# Patient Record
Sex: Female | Born: 1965 | Hispanic: No | Marital: Married
Health system: Midwestern US, Community
[De-identification: ages and names within clinical notes are randomized; demographics above are authoritative.]

---

## 2012-02-29 LAB — BASIC METABOLIC PANEL
BUN: 5 mg/dL
Calcium: 8.7 mg/dL
Chloride: 101 mmol/L
Glucose: 118 mg/dL
Potassium: 4.3 mmol/L
Sodium: 138 mmol/L

## 2012-02-29 LAB — CBC
Hematocrit: 40.2 % (ref 36–46)
Hemoglobin: 13.7 g/dL (ref 12.0–16.0)
Platelets: 198 K/??L
WBC: 6.5 10^3/mL

## 2012-02-29 LAB — LIPID PANEL
Chol/HDL Ratio: 2.6
Cholesterol, Total: 210
Cholesterol, Total: 210
HDL: 78 mg/dL — AB (ref 35–70)
HDL: 78 mg/dL — AB (ref 35–70)
LDL Calculated: 131 mg/dL (ref 0–160)
LDL Calculated: 131 mg/dL (ref 0–160)
Triglycerides: 139 mg/dL
Triglycerides: 139 mg/dL
VLDL: 27 mg/dL

## 2012-02-29 LAB — COMPREHENSIVE METABOLIC PANEL
BUN: 5 mg/dL
Calcium: 8.7 mg/dL
Chloride: 101 mmol/L
Creatinine: 0.7
Gfr Calculated: 60
Glucose: 118 mg/dL
Potassium: 4.3 mmol/L
Sodium: 138 mmol/L

## 2012-02-29 LAB — AST(SGOT) & ALT(SGPT)
ALT: 49 U/L
AST: 45 U/L

## 2012-06-30 LAB — HEMOGLOBIN A1C
Hemoglobin A1C: 7.9 %
Hemoglobin A1C: 7.9 %

## 2012-07-12 MED ORDER — LANCETS MISC
Status: AC
Start: 2012-07-12 — End: ?

## 2012-07-12 MED ORDER — GLUCOSE BLOOD VI STRP
ORAL_STRIP | Status: AC
Start: 2012-07-12 — End: ?

## 2012-07-12 NOTE — Progress Notes (Signed)
Subjective:      Patient ID: Hailey Reynolds is a 47 y.o. female.    Diabetes  She presents for her initial diabetic visit. She has type 2 diabetes mellitus. Onset time: 2014. There are no hypoglycemic associated symptoms. There are no diabetic associated symptoms. There are no hypoglycemic complications. There are no diabetic complications. Risk factors for coronary artery disease include diabetes mellitus, family history, obesity and post-menopausal. Current diabetic treatment includes oral agent (monotherapy). She is compliant with treatment most of the time. Her weight is stable. She is following a generally healthy diet. When asked about meal planning, she reported none. She has not had a previous visit with a dietician. She rarely participates in exercise. There is no compliance with monitoring of blood glucose. An ACE inhibitor/angiotensin II receptor blocker is not being taken. She sees a podiatrist Warden/ranger every 3 weeks for nerve pain).Eye exam is current.       Review of Systems    Objective:   Physical Exam    Assessment:      1 hr pp 163 with 50 gm CHO's.  No A1c to evalaute.  Hailey Reynolds has not been metering and instruction was given on One Touch glucose meter (given) with e-script for strips and lancets called into [facility].  Reviewed CHO's, diet and lifestyle.   Hailey Reynolds states she has been on intermittent doses of steroids and as a result sugars elevated.  Reviewed diabetes pathophysiology with instruction given on appropriate metering and goals. PLAN: follow educational groups; then RD and RN      Plan:      1.  Check sugar with wake up (goal less than 130) OR 2 hr after a higher carb meal (goal less than 160)  2.  Keep food/sugar log and bring with meter to all clinic visits.  3.  Exercise is huge. Try to incorporate it in your day.  30 min a day most days of the week is the goal.  This will help you utilize your own insulin better.  4.  NO sugary drinks or juice.  Limit carbs to no more than  50 gm/meal or 3 servings to assist with sugar control and weight loss.  5.  Follow up educational groups.        Patient education time 60 min

## 2012-07-12 NOTE — Patient Instructions (Signed)
1.  Check sugar with wake up (goal less than 130) OR 2 hr after a higher carb meal (goal less than 160)  2.  Keep food/sugar log and bring with meter to all clinic visits.  3.  Exercise is huge. Try to incorporate it in your day.  30 min a day most days of the week is the goal.  This will help you utilize your own insulin better.  4.  NO sugary drinks or juice.  Limit carbs to no more than 50 gm/meal or 3 servings to assist with sugar control and weight loss.  5.  Follow up educational groups.

## 2012-07-19 NOTE — Progress Notes (Signed)
Patient presented for the [facility]  education class. The content was presented via PowerPoint, lecture and case-based format covering the following concepts: 60mins education.    ?? What is Diabetes?  ?? Define glycosolation  ?? Interpretation of the A1C and goal.  ?? Pancreatic function  ?? Target organs and macro and microvascular complications  ?? Goals for SGM BP goals  ?? Meal clearance   ?? S&S hyper/hypoglycemia and tx   ?? Insulin physiology-basal/bolus  ?? Navigating sick days stress  ?? Relationship between diet, exercise, meds and stress  ??  Utilizing heath care system at appropriate time  ?? Assuming responsibility in self management  ?? Troubleshooting patterns

## 2012-09-05 NOTE — Progress Notes (Deleted)
[facility]  [facility]  Nutrition Counseling  [address]  [place], [place] [address]  [phone] (phone)  [phone] (fax)    Patient Name: Hailey Reynolds. Date of Birth: [phone]. MRN: [id number]      Assessment: Patient is a 47 y.o. female seen for initial MNT visit for Type 2 Diabetes recent diagnosis. Vitals from current and previous visits:  ***   -Body mass index is 42.9 kg/(m^2). 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   -Weight goal: lose weight.   -Nutritionally relevant labs:   Lab Results   Component Value Date/Time    LABA1C 7.9 06/30/2012    LABA1C 7.9 06/30/2012    GLUCOSE 118 02/29/2012    GLUCOSE 118 02/29/2012    CHOL 210 02/29/2012    CHOL 210 02/29/2012    HDL 78* 02/29/2012    HDL 78* 02/29/2012    LDLCALC 131 02/29/2012    LDLCALC 131 02/29/2012    TRIG 139 02/29/2012    TRIG 139 02/29/2012     Vitals 07/12/2012 09/05/2012   SYSTOLIC 130    DIASTOLIC 82    PULSE 72    WEIGHT 264.4 257.8   HEIGHT 5\' 5"  5\' 5"    BMI 44 kg/m2 42.9 kg/m2     -Blood sugar trends: checks 2x/day. Morning when awake, then 2 hours after last meal, reports pretty good about it but occ forgets. 64 y.o. daughter, and husband lives with her. 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   -Newly dx with T2DM. Has been on Janumet (metformin) 2 mos., since then, food aversion to spring mix salad, doesn't want to cook much, doesn't eat rice or potatoes. Loves [place] foods and tortilla chips lately.  -Has been reading daughter's cho counting packet.  -Likes high fat foods but trying to watch portions, had portion of nachos bellgrande. Tries to count out chips when having snacks at home.  -Allergic to nuts.    -Food recall:   Breakfast: sometimes skips, often wakes up not hungry.   Lunch: (12-2pm) might stop and get fast food cheeseburger, doesn't  get fries anymore but eats out a lot. Tries to take "smart one" meals to work but sometimes doesn't get to it.  Dinner:(7pm-8:30), goes to bed at about 11pm-midnight.   Snacks: chips and salsa.   Fluids: Gave up pop, if wants sweet, has diet pop or tea with splenda or natural fruit tea.  Eats out: >4x/week at lunch time. On weekends rarely eats out.  Exercise: physical labor at job, walks dogs, cleans windows, mops.    -Impression of Dietary Intake: in general, an "unhealthy" diet, on average, 2 meals per day, on average, >3 fast food meals per week.    -  Current Outpatient Prescriptions on File Prior to Visit   Medication Sig Dispense Refill   . SitaGLIPtin-MetFORMIN HCl (JANUMET PO) Take  by mouth 2 times daily.       . cetirizine (ZYRTEC) 10 MG tablet Take 10 mg by mouth daily.       . Fluticasone Propionate (FLONASE NA) 2 sprays by Nasal route daily.       . Cyanocobalamin (VITAMIN B 12 PO) Take  by mouth once a week. Spray once a week       . citalopram (CELEXA) 10 MG tablet Take 10 mg  by mouth daily.       Marland Kitchen lamoTRIgine (LAMICTAL) 150 MG tablet Take 150 mg by mouth 2 times daily.       Marland Kitchen glucose blood VI test strips (ONE TOUCH ULTRA TEST) strip Test BID AD  Dx:  250.02  100 strip  11   . Lancets MISC Test BID AD.  Dx:  250.02  100 each  11     No current facility-administered medications on file prior to visit.       Nutrition Diagnosis: Food and nutrition-related knowledge deficit related to Lack of previous MNT/currently undergoing MNT as evidenced by Food/nutrition history-report or observation of: skipping breakfast and frequently eating out.    Intervention:  -Impression: Patient has been newly diagnosed with Type 2 DM. Has very basic understanding of diabetic diet from reading daughter's cho counting packet. Skips breakfast and relies heavily on fast food. Enc small, freq meals starting with eating breakfast and midmorning snack. Reports avoiding breads and does not seem to be getting adequate cho AEB  food log and pt report, also dislikes cooking and has been eating higher fat foods when eating out. When pt used to cook meals in advance husband would end up eating it so pt has been discouraged from cooking lately. Discussed benefits of pre portioning and cooking meals at home. Enc lean meats and reading food labels when grocery shopping. 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     -Instructed the patient on: Carbohydrate Counting, Consistent Carbohydrate Intake, Exchange System for Carbohydrate Counting & Meal Planning, Food Label Reading, Healthy Choices When Dining Out, Meal Planning for Regular, Balanced Meals & Snacks and The Importance of Regular Physical Activity.  -Handouts given for: carbohydrate counting, food label reading tips, food logging, healthy snacks and sample meal plans/menus.    -There are no Patient Instructions on file for this visit.    -General Diet Recommendations: low fat, low cholesterol, minimize processed foods, minimize simple sugars and increase fiber intake.  -Nutrition prescription: 1600-1800 calories/day, 180-190g carbs/day.     Comprehension verified using teachback method.    Monitoring/Evaluation:   -Followup visit: 4 weeks with dietitian.   -Receptiveness to education/goals: Agreeable.  -Evaluation of education: Indicates understanding.  -Readiness to change: action - ready to set action plan and implement ***.  -Expected compliance: good.    Thank you for your referral of this patient.    Total time involved in direct patient education: 60 minutes.   Note was created by Phillips Hay, dietetic intern.  Note reviewed by Elzie Rings RD, LD    *** go to problem list activity, click on mark as review and delete this line.  ***go to immunization activity, click mark as review and delete this line.

## 2012-09-05 NOTE — Progress Notes (Signed)
[facility]  [facility] Management Group  Nutrition Counseling  [address]  [place], [place] [address]  [phone] (phone)  [phone] (fax)    Patient Name: Hailey Reynolds. Date of Birth: [phone]. MRN: [id number]      Assessment: Patient is a 47 y.o. female seen for initial MNT visit for Type 2 Diabetes recent diagnosis.      -Body mass index is 42.9 kg/(m^2). 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   -Weight goal: lose weight.   -Nutritionally relevant labs:   Lab Results   Component Value Date/Time    LABA1C 7.9 06/30/2012    LABA1C 7.9 06/30/2012    GLUCOSE 118 02/29/2012    GLUCOSE 118 02/29/2012    CHOL 210 02/29/2012    CHOL 210 02/29/2012    HDL 78* 02/29/2012    HDL 78* 02/29/2012    LDLCALC 131 02/29/2012    LDLCALC 131 02/29/2012    TRIG 139 02/29/2012    TRIG 139 02/29/2012     Vitals from current and previous visits:  Vitals 07/12/2012 09/05/2012   SYSTOLIC 130    DIASTOLIC 82    PULSE 72    WEIGHT 264.4 257.8   HEIGHT 5\' 5"  5\' 5"    BMI 44 kg/m2 42.9 kg/m2     -Blood sugar trends: Meter downloaded and scanned.  checks 2x/day. Morning when awake, then 2 hours after last meal, reports pretty good about it but occ forgets.   Household includes 42 y.o. daughter, and husband. 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   -Newly dx with T2DM. Has been on Janumet (metformin) 2 mos., since then, food aversion to spring mix salad, doesn't want to cook much, doesn't eat rice or potatoes. Loves [place] foods and tortilla chips lately.  -Has been reading daughter's cho counting packet.  Daughter was seen by dietitian at pediatric specialty clinic.  -Likes high fat foods but trying to watch portions, had portion of nachos bellgrande. Tries to count out chips when having snacks at home.  -Allergic to nuts.    -Food recall: Pt brought in food  logs.  Breakfast: sometimes skips, often wakes up not hungry.   Lunch: (12-2pm) might stop and get fast food cheeseburger, doesn't get fries anymore but eats out a lot. Tries to take "smart one" meals to work but sometimes doesn't get to it.  Dinner:(7pm-8:30), goes to bed at about 11pm-midnight.   Snacks: chips and salsa.   Fluids: Gave up pop, if wants sweet, has diet pop or tea with splenda or natural fruit tea.  Eats out: >4x/week at lunch time. On weekends rarely eats out.  Exercise: physical labor at job, walks dogs, cleans windows, mops.    -Impression of Dietary Intake: in general, an "unhealthy" diet, on average, 2 meals per day, on average, >3 fast food meals per week.      Current Outpatient Prescriptions on File Prior to Visit   Medication Sig Dispense Refill   ??? SitaGLIPtin-MetFORMIN HCl (JANUMET PO) Take  by mouth 2 times daily.       ??? cetirizine (ZYRTEC) 10 MG tablet Take 10 mg by mouth daily.       ??? Fluticasone Propionate (FLONASE NA) 2 sprays by Nasal route daily.       ??? Cyanocobalamin (VITAMIN B 12 PO) Take  by  mouth once a week. Spray once a week       ??? citalopram (CELEXA) 10 MG tablet Take 10 mg by mouth daily.       ??? lamoTRIgine (LAMICTAL) 150 MG tablet Take 150 mg by mouth 2 times daily.       ??? glucose blood VI test strips (ONE TOUCH ULTRA TEST) strip Test BID AD  Dx:  250.02  100 strip  11   ??? Lancets MISC Test BID AD.  Dx:  250.02  100 each  11     No current facility-administered medications on file prior to visit.       Nutrition Diagnosis: Food and nutrition-related knowledge deficit related to Lack of previous MNT/currently undergoing MNT as evidenced by Food/nutrition history-report or observation of: skipping breakfast and frequently eating out.    Intervention:  -Impression: Patient has been newly diagnosed with Type 2 DM. Has very basic understanding of diabetic diet from reading daughter's cho counting packet. Skips breakfast and relies heavily on fast food. Enc small, freq  meals starting with eating breakfast or midmorning snack. Reports avoiding breads and does not seem to be getting adequate cho AEB food log and pt report, also dislikes cooking and has been eating higher fat foods when eating out. When pt used to cook meals in advance husband would end up eating it so pt has been discouraged from cooking lately. Discussed benefits of pre portioning and cooking meals at home. Enc lean meats and reading food labels when grocery shopping. 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     -Instructed the patient on: Carbohydrate Counting, Consistent Carbohydrate Intake, Exchange System for Carbohydrate Counting & Meal Planning, Food Label Reading, Healthy Choices When Dining Out, Meal Planning for Regular, Balanced Meals & Snacks and The Importance of Regular Physical Activity.  -Handouts given for: carbohydrate counting, food label reading tips, food logging, healthy snacks and sample meal plans/menus.    -Patient Instructions   Goals:  1. Good job testing your blood sugars! Keep it up!    Blood Sugar Goals: Less than 130mg /dl before breakfast              Less than 160mg /dl before bed  2. Eat small, frequent meals and start eating breakfast! This will help prevent you from overeating. Don't go more than 4-6 hours without eating. Refer to meal plan.  3. Look at food labels to make sure you stay within carb range at each meal or snack.  4. Nice food log, continue logging and bring to next appointment (4 weeks)!  5. On days you are not working, exercise! Go for walks.      -General Diet Recommendations: low fat, low cholesterol, minimize processed foods, minimize simple sugars and increase fiber intake.  -Nutrition prescription: 1600-1800 calories/day, 180-190g carbs/day.     Comprehension verified using teachback method.    Monitoring/Evaluation:   -Followup visit: 4 weeks with dietitian.   -Receptiveness to  education/goals: Agreeable.  -Evaluation of education: Indicates understanding.  -Readiness to change: action - ready to set action plan and implement.  -Expected compliance: good.    Thank you for your referral of this patient.    Total time involved in direct patient education: 60 minutes.   Note was created by Phillips Hay, dietetic intern.  Note reviewed by Elzie Rings RD, LD

## 2012-09-05 NOTE — Patient Instructions (Addendum)
Goals:  1. Good job testing your blood sugars! Keep it up!    Blood Sugar Goals: Less than 130mg /dl before breakfast              Less than 160mg /dl before bed  2. Eat small, frequent meals and start eating breakfast! This will help prevent you from overeating. Don't go more than 4-6 hours without eating. Refer to meal plan.  3. Look at food labels to make sure you stay within carb range at each meal or snack.  4. Nice food log, continue logging and bring to next appointment (4 weeks)!  5. On days you are not working, exercise! Go for walks.

## 2014-11-19 ENCOUNTER — Ambulatory Visit
Admit: 2014-11-19 | Discharge: 2014-11-19 | Payer: BLUE CROSS/BLUE SHIELD | Attending: Registered Nurse | Primary: Family Medicine

## 2014-11-19 DIAGNOSIS — E119 Type 2 diabetes mellitus without complications: Secondary | ICD-10-CM

## 2014-11-19 NOTE — Progress Notes (Signed)
Subjective:      Patient ID: Hailey Reynolds is a 49 y.o. female.    Diabetes  She presents for her initial diabetic visit. She has type 2 diabetes mellitus. No MedicAlert identification noted. Onset time: diagnosed 2014. Her disease course has been stable. There are no hypoglycemic associated symptoms. There are no diabetic associated symptoms. There are no hypoglycemic complications. Risk factors for coronary artery disease include obesity, sedentary lifestyle and stress. Current diabetic treatment includes diet. She is compliant with treatment most of the time. Her weight is stable. Diabetic current diet: up 6am or 8am bk-smart ones (burrito). L 12nn  leftovers-meats/ gravy.  D salad/ meat. AM snack-berries/ nuts. eve snack-popcorn. Drinks-ice tea/ water/ occas d. pepsi. When asked about meal planning, she reported none. She has not had a previous visit with a dietitian. Exercise: yoga-2 times per week 45 min. Home blood sugar record trend: no testing in the past several days. An ACE inhibitor/angiotensin II receptor blocker is not being taken. She does not see a podiatrist (remote history of seeing Dr. Erma PintoHaycock).Eye exam is current (10/2014-cataracts/ no retinopathy).       Review of Systems    Objective:   Physical Exam--no dist    Assessment:     New visit for Diabetes education (was seen approx 2 years ago for education). Blood sugars are not in good control; most readings over 200. She reports being on Janumet that she did not tolerate--nausea and vomiting; and Glymbaxi that she did not tolerate-- joint pain. She is currently diet only, but has a script for Onglyza that she will begin tomorrow. The medication choices available were reviewed. Hailey Reynolds's meal plan, as reported, is not far off goal. However there is limited exercise. She is receptive to making changes in diet and exercise. She will keep a foodlog and follow up with the dietician in 1-2 weeks.     Plan:   Reviewed physiology of Diabetes; BS goals;  DM medication actions; carbs; exercise  1. Check blood sugar 2 times each day--waking up and either before your evening meal or before going to bed            Blood sugar goal  90-130  2. Think about exercise plan for 5-6 days per week--at least 20 minutes+              2 days+ yoga per week; plus stretch bands; physical therapy; pedaling; etc  3. 3 meals per day with 30-45 grams carb per meal. Snacks --limit to only 15 grams carb. Fill up on salads and low carb vegetables  4. Drink plenty of water   6-8 glasses per day  5. Start taking Onglyza as ordered.  6. Keep a log of what you are eating and how much, plus your blood sugars. Bring to your next appointment.  French Anaracy voices understanding of above instructions via teach back and willingness to participate in the above plan of care.  Time spent in direct contact with patient:  60 minutes.

## 2014-11-19 NOTE — Patient Instructions (Addendum)
1. Check blood sugar 2 times each day--waking up and either before your evening meal or before going to bed            Blood sugar goal  90-130  2. Think about exercise plan for 5-6 days per week--at least 20 minutes+              2 days+ yoga per week; plus stretch bands; physical therapy; pedaling; etc  3. 3 meals per day with 30-45 grams carb per meal. Snacks --limit to only 15 grams carb. Fill up on salads and low carb vegetables  4. Drink plenty of water   6-8 glasses per day  5. Start taking Onglyza as ordered.  6. Keep a log of what you are eating and how much, plus your blood sugars. Bring to your next appointment.

## 2014-11-29 ENCOUNTER — Ambulatory Visit
Admit: 2014-11-29 | Discharge: 2014-11-29 | Payer: BLUE CROSS/BLUE SHIELD | Attending: Registered" | Primary: Family Medicine

## 2014-11-29 DIAGNOSIS — E119 Type 2 diabetes mellitus without complications: Secondary | ICD-10-CM

## 2014-11-29 NOTE — Patient Instructions (Addendum)
Goals:  1) Aim for up to 146 g of carbohydrates per day divided into 3 meals and 3 snacks.  2) Eat no more than 45 g at breakfast, lunch, and dinner. Eat no more than 15 g of carbohydrates at snacks.  3) Always include a protein with your meals and snacks. This helps with fullness and blood sugar control.   4) Keep a daily food log- record meal times, foods ate, and blood sugars. Please bring to your follow-up appointment.    Suggestons:  [employer]  (zero calorie, zero carbohydrates)  Quinoa (cooks similar to rice)  Banza Pasta (chickpea pasta, higher protein and fiber)

## 2014-11-29 NOTE — Progress Notes (Signed)
[facility]  [facility]  Nutrition Counseling  [address], [address]  [place], [place] [address]  [phone] (phone)  [phone] (fax)    Patient Name: Hailey Reynolds. Date of Birth: [id number]. MRN: [id number]      Assessment: Patient is a 49 y.o. female seen for follow-up MNT visit for  type II diabetes (initial visit in 2014).     Vitals from current and previous visits:    Vitals 11/19/2014 11/29/2014   SYSTOLIC     DIASTOLIC     Pulse     Weight 027 lb 14.4 oz 249 lb 11.2 oz   Height     BMI (wt*703/ht~2) 41.58 kg/m2 41.55 kg/m2        -Body mass index is 41.55 kg/(m^2). 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   Hailey Reynolds's weight remained stable / unchanged since [facility].  -Weight goal: lose weight.     -Nutritionally relevant labs:   Lab Results   Component Value Date/Time    LABA1C 7.9 06/30/2012    LABA1C 7.9 06/30/2012    GLUCOSE 118 02/29/2012    GLUCOSE 118 02/29/2012    CHOL 210 02/29/2012    CHOL 210 02/29/2012    HDL 78* 02/29/2012    HDL 78* 02/29/2012    LDLCALC 131 02/29/2012    LDLCALC 131 02/29/2012    TRIG 139 02/29/2012    TRIG 139 02/29/2012       Blood sugar trends:  Patient presented without meter. Patient followed with RN-CDE on June 6 and has since began taking Onglyza as prescribed. Patient reports metering 2x/day, with both fasting and bedtime blood sugars averaging between 150-170. Patient expresses feeling some nausea in the evenings that she associates with the start of Onglyza.       -Food recall:     Patient lives with husband and 69 year old daughter. Patient reports recently being laid-off and does not have as routine of schedule.Patient reports making effort to stay within 15-45 g carb limit at meals as discussed at RN-CDE visit. Patient states she has been eating more beans (over potatoes) and vegetables. Patient states reducing pasta portions has been the biggest struggle with pasta being a  household staple.     Breakfast: lack of appetite in the morning, but eating within 1 hour of waking. berries OR salad OR Protein Shake (Supreme Protein Powder with 4 oz 1% milk and 4 oz water)  Snack: almonds, cashews, cheese slice, or dannon light yogurt ([place])   Lunch: salad (spring mix, feta cheese, tomatoes, black olives, 5-6 croutons, bacon bits, black beans and 2 tbsp. Catalina Dressing).   Dinner: salad (with veggies), corn or beans, protein (chicken, fish,  pork, beef)  Snacks: pop corn, redi-whip for something sweet, peanut butter   Main Beverages: >64 oz of water/day, decaf ice tea, occasional Diet Pepsi.     -Impression of Dietary Intake: in general, a "healthy" diet  , on average, 3 meals per day, diabetic.     -  Current Outpatient Prescriptions on File Prior to Visit   Medication Sig Dispense Refill   ??? FLUoxetine (PROZAC) 20 MG capsule Take 20 mg by mouth 2 times daily     ??? sulfaSALAzine (AZULFIDINE) 500 MG tablet Take 500 mg by mouth 2 times daily     ??? ONGLYZA 5 MG TABS tablet Take 5 mg by mouth daily  0   ???  cetirizine (ZYRTEC) 10 MG tablet Take 10 mg by mouth daily.     ??? Fluticasone Propionate (FLONASE NA) 2 sprays by Nasal route daily.     ??? Cyanocobalamin (VITAMIN B 12 PO) Take  by mouth once a week. Spray once a week     ??? lamoTRIgine (LAMICTAL) 150 MG tablet Take 150 mg by mouth 2 times daily.     ??? glucose blood VI test strips (ONE TOUCH ULTRA TEST) strip Test BID AD  Dx:  250.02 100 strip 11   ??? Lancets MISC Test BID AD.  Dx:  250.02 100 each 11     No current facility-administered medications on file prior to visit.       Nutrition Diagnosis: Food and nutrition-related knowledge deficit related to Lack of previous MNT/currently undergoing MNT as evidenced by patient report.     Intervention:  -Impression: Annamaria is a pleasant female who presented with multiple questions regarding carbohydrates and how many are recommended per day. Patient refers to nutrition labels and has been reducing  meal portions. Patient expressed struggling with bedtime snacking and choosing healthy breakfast options. Patient has a willingness to make overall healthy lifestyle changes but expressed desire to begin her focus on carbohydrate control. Patient reports having flat feet which make it difficult for her to walk for long time spans. Patient has been making an effort to increase her activity level through yard work and "gentle yoga" at home.     -Instructed the patient on: Recommended 146 g of carbohydrates/day be split into 3 meals and 3 snacks. Discussed with the patient reading serving size and total carbohydrates on the nutrition facts label. Reviewed the difference between fiber and sugars and their impact on blood sugar levels. Offered breakfast and snack suggestions with the emphasis on including protein with healthy carbohydrate choices. Provided patient with portion plate for assistance with serving sizes and portion control. Patient agreeable to food logging.     -Handouts given for: Carbohydrate Counting Booklet, Healthy Snacks, Healthy Breakfast Suggestions, Milk Comparisons, Chair Exercises, and Food Logs.     -  Patient Instructions   Goals:  1) Aim for up to 146 g of carbohydrates per day divided into 3 meals and 3 snacks.  2) Eat no more than 45 g at breakfast, lunch, and dinner. Eat no more than 15 g of carbohydrates at snacks.  3) Always include a protein with your meals and snacks. This helps with fullness and blood sugar control.   4) Keep a daily food log- record meal times, foods ate, and blood sugars. Please bring to your follow-up appointment.    Suggestons:  [employer]  (zero calorie, zero carbohydrates)  Quinoa (cooks similar to rice)  Banza Pasta (chickpea pasta, higher protein and fiber)      -General Diet Recommendations: minimize simple sugars, increase fiber intake and consistent carbohydrate intake.  -Nutrition prescription: 1300 calories/day, 146 g carbs/day: 30-45 g carbs/3  meals, 15 g carbs/3 snacks.    Comprehension verified using teachback method.    Monitoring/Evaluation:   -Followup visit: 4 weeks with dietitian.   -Receptiveness to education/goals: Agreeable.  -Evaluation of education: Indicates understanding.  -Readiness to change: action - ready to set action plan and implement carb controled meals.  -Expected compliance: good.    Thank you for your referral of this patient.    Total time involved in direct patient education: 60 minutes.

## 2014-12-27 ENCOUNTER — Encounter: Payer: BLUE CROSS/BLUE SHIELD | Attending: "Nutrition | Primary: Family Medicine

## 2021-03-11 IMAGING — MG MAMMOGRAPHY SCREENING BILATERAL 3[PERSON_NAME]
8 series · 8 of 24 positions shown · non-contrast
Comparison: Delay in the report was to obtain prior mammograms for comparison. 
They remain unavailable for comparison at the time of this dictation.

________________________________________________________________________________________________ 
MAMMOGRAPHY SCREENING BILATERAL 3MARAM HW, 03/11/2021 [DATE]: 
CLINICAL INDICATION: Screening.
TECHNIQUE: Digital bilateral mammograms and 3-D Tomosynthesis were obtained. 
These were interpreted both primarily and with the aid of computer-aided 
detection system.  
BREAST DENSITY: (Level B) There are scattered areas of fibroglandular density.

[L CC]
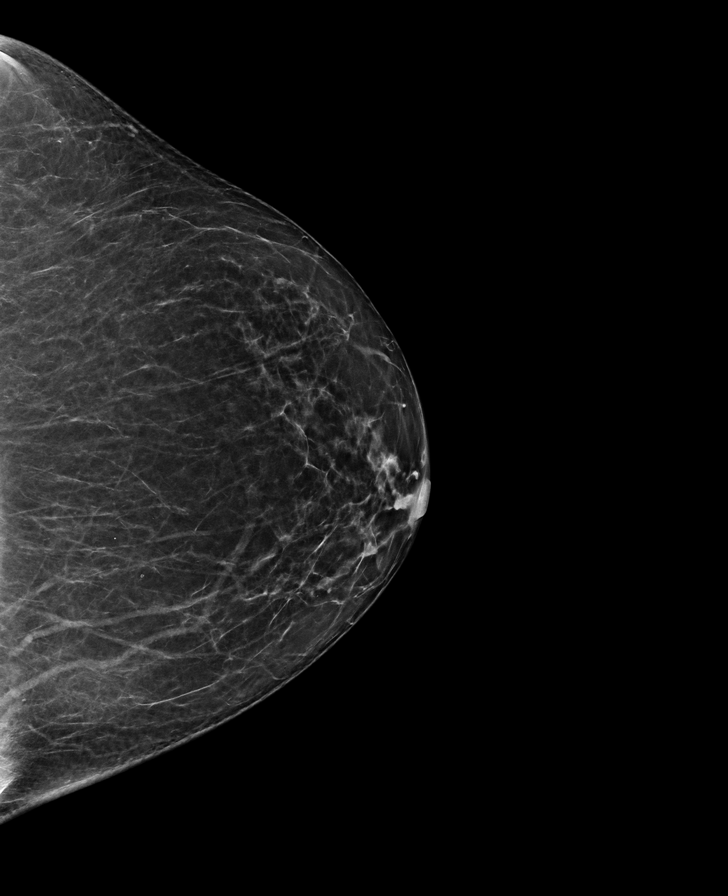

[L MLO]
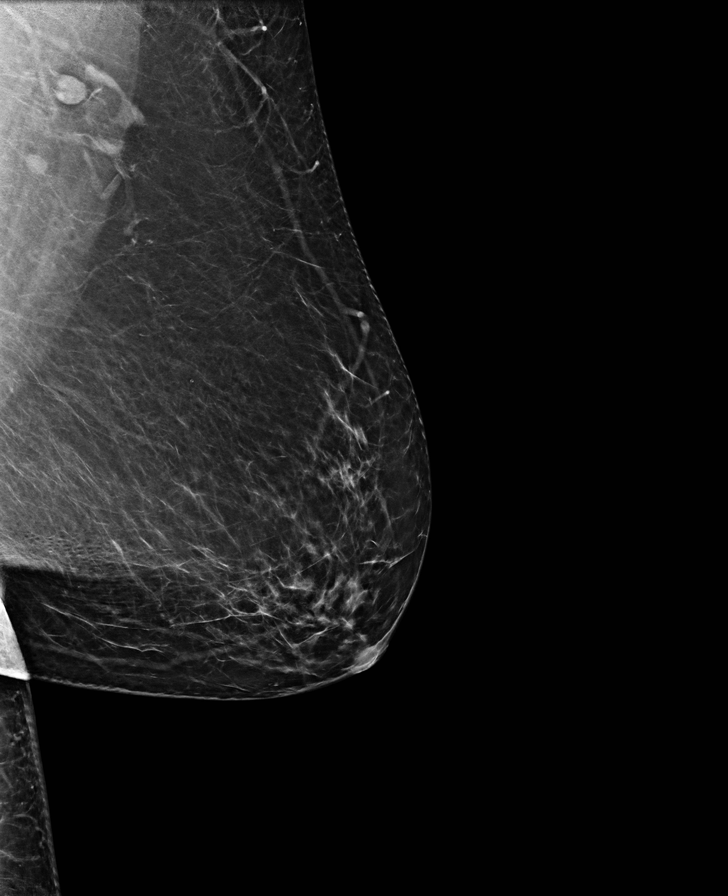

[R MLO]
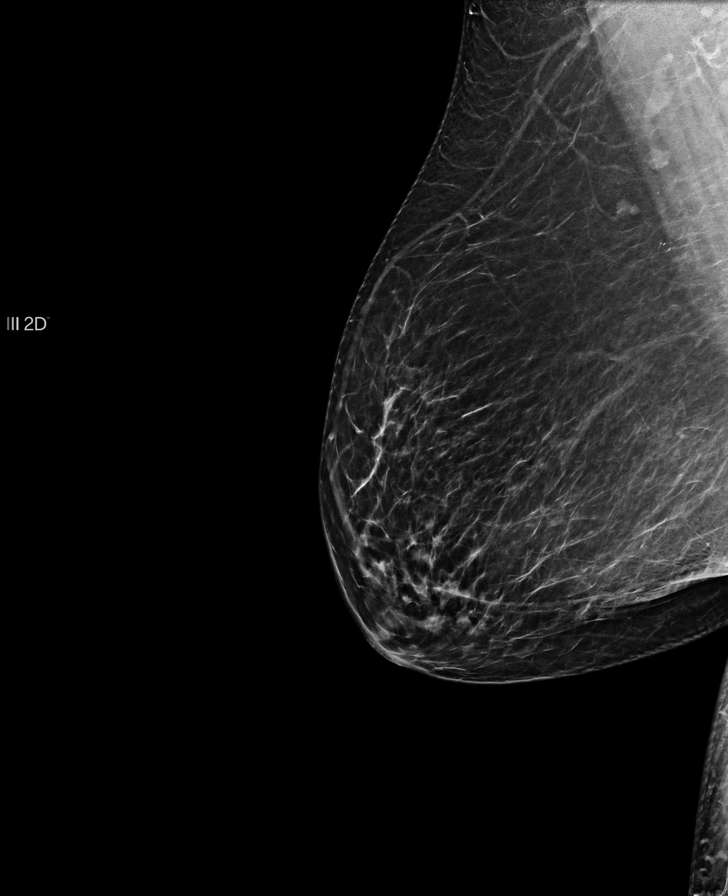

[R CC]
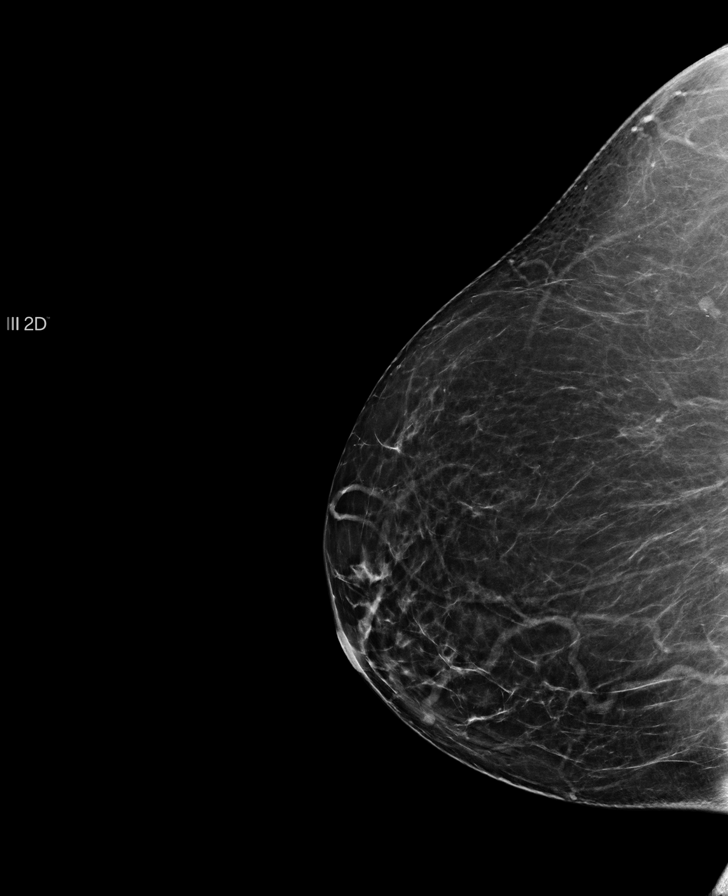

[L MLO tomo · tomo slice 39/77.0]
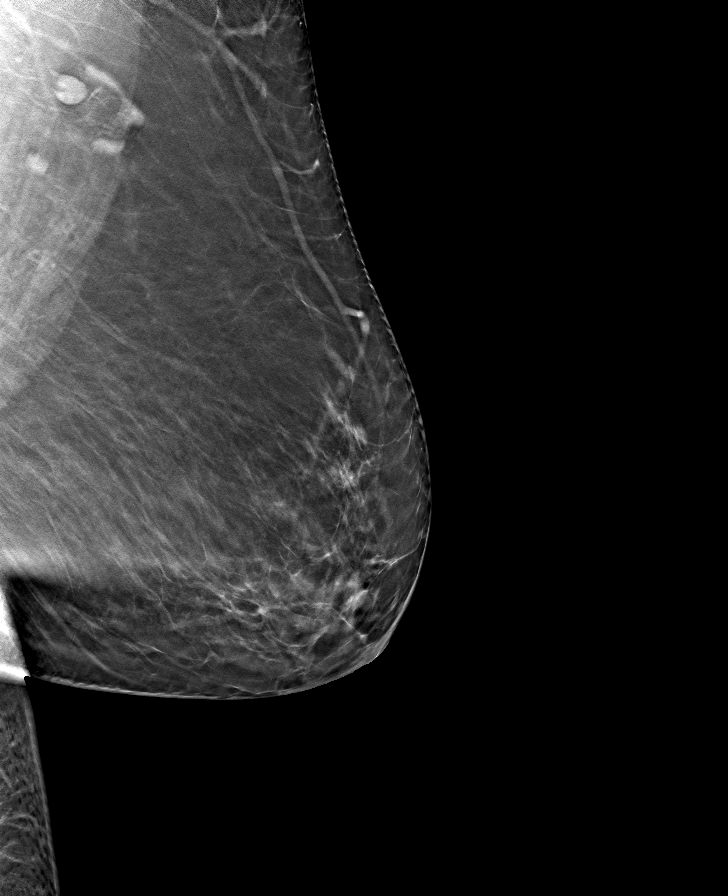

[L CC tomo · tomo slice 32/63.0]
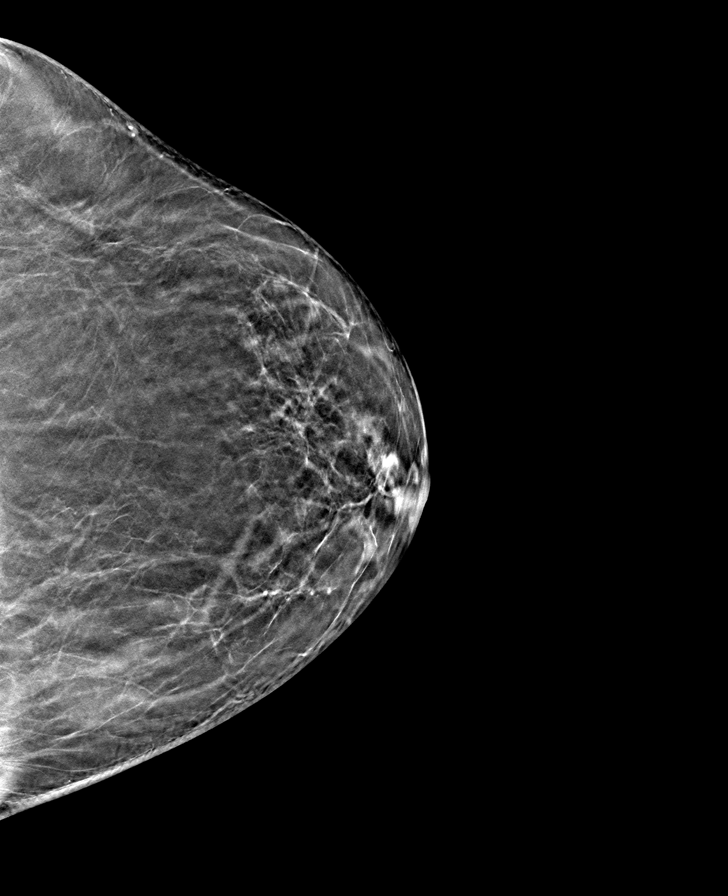

[R MLO tomo · tomo slice 35/70.0]
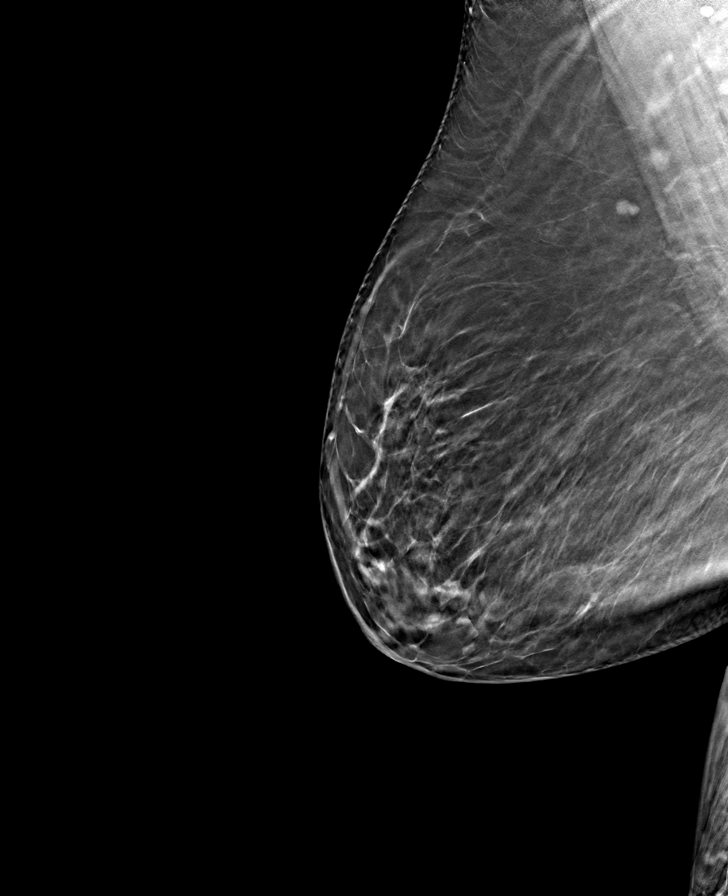

[R CC tomo · tomo slice 33/66.0]
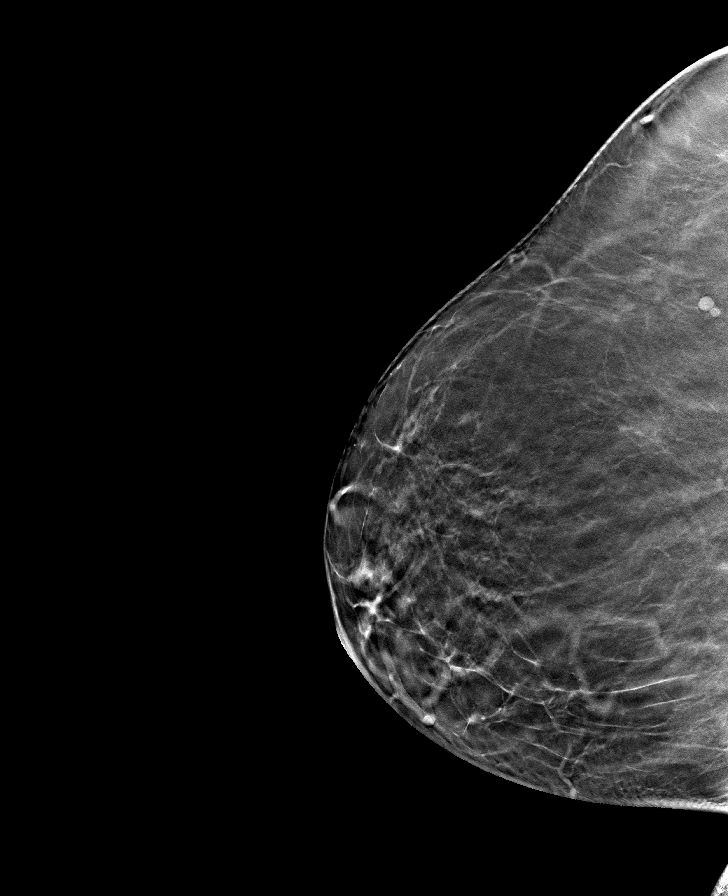

[8 of 24 positions shown; findings below may reference images not displayed]

FINDINGS: No suspicious mass, calcifications, or area of architectural 
distortion in either breast.
IMPRESSION: No mammographic findings suggestive for malignancy. 
(BI-RADS 2) Benign findings. Routine mammographic follow-up is recommended.

## 2022-11-04 IMAGING — DX HIP 2 VIEWS LEFT WITH PELVIS
1 series · 3 of 3 positions shown · non-contrast
Comparison: None.

________________________________________________________________________________________________ 
HIP 2 VIEWS LEFT WITH PELVIS, 11/04/2022 [DATE]: 
CLINICAL INDICATION: Pain in left hip.

[Series 1: AP · U · 0.14mm/px · 3 of 3 slices shown]
[im 1/3]
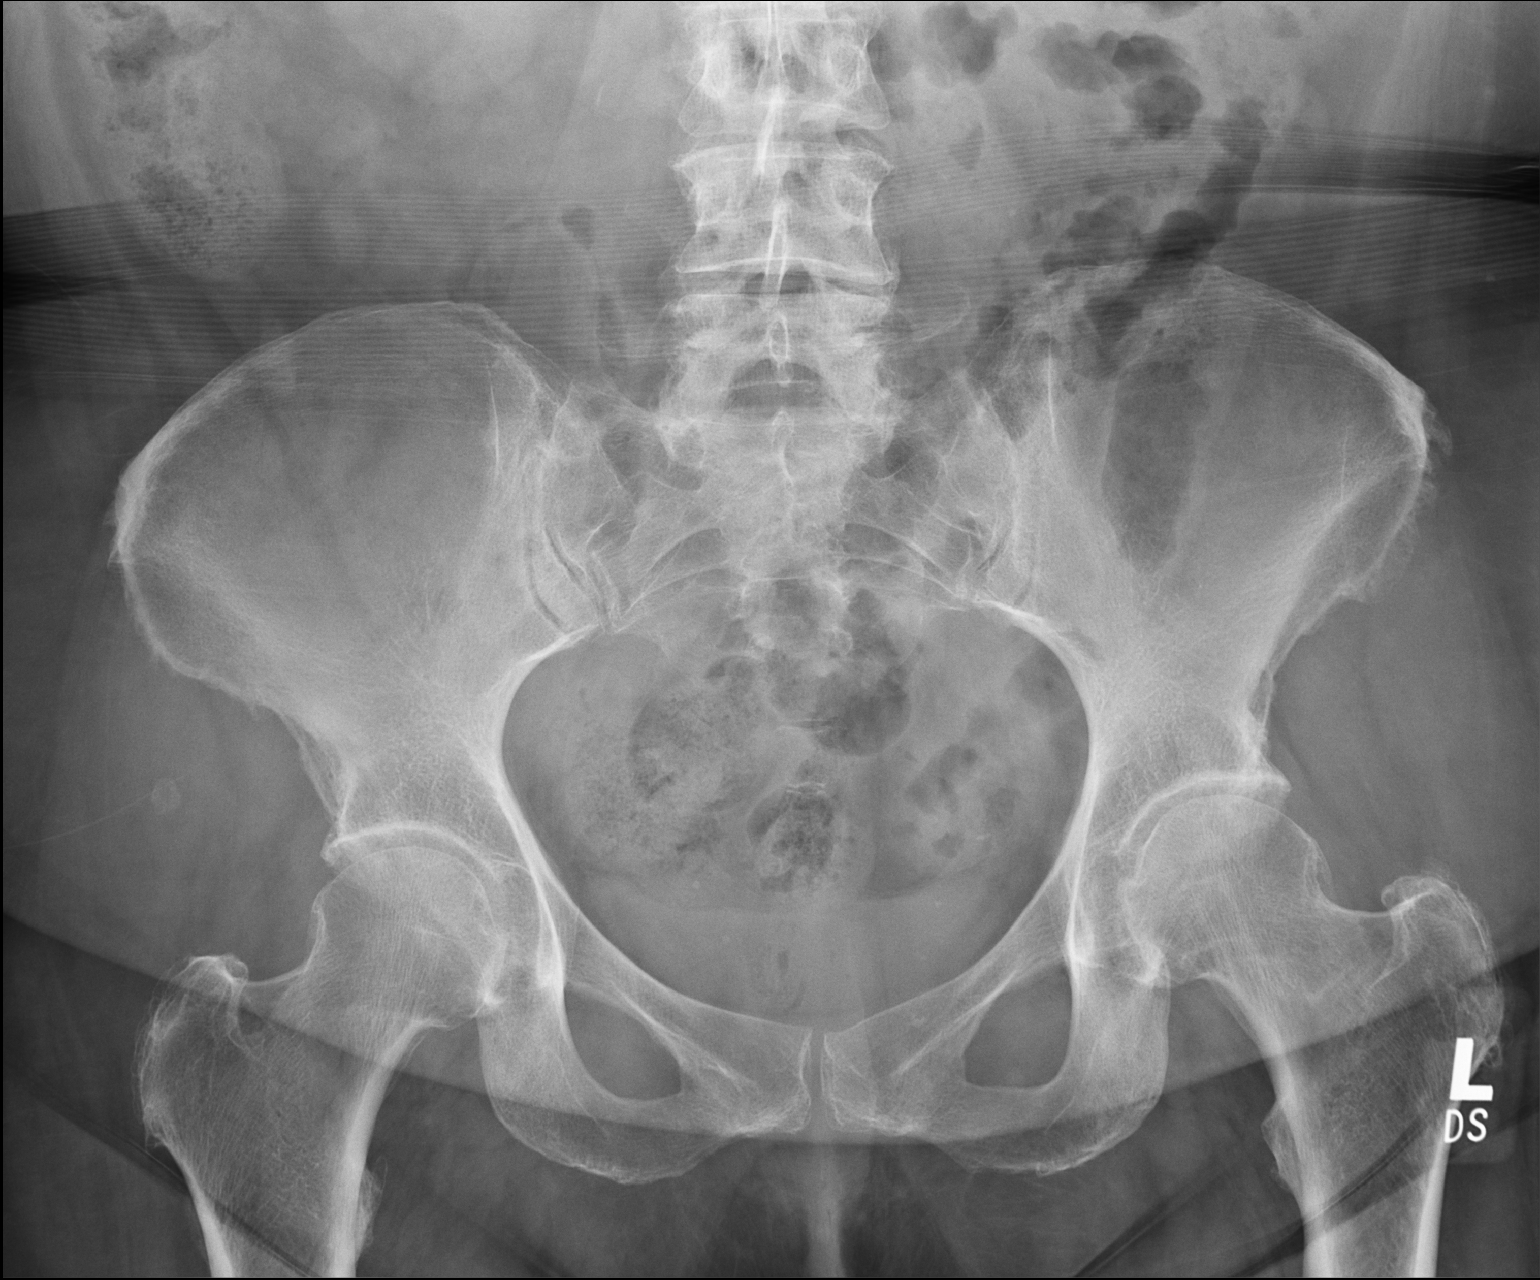
[im 2/3]
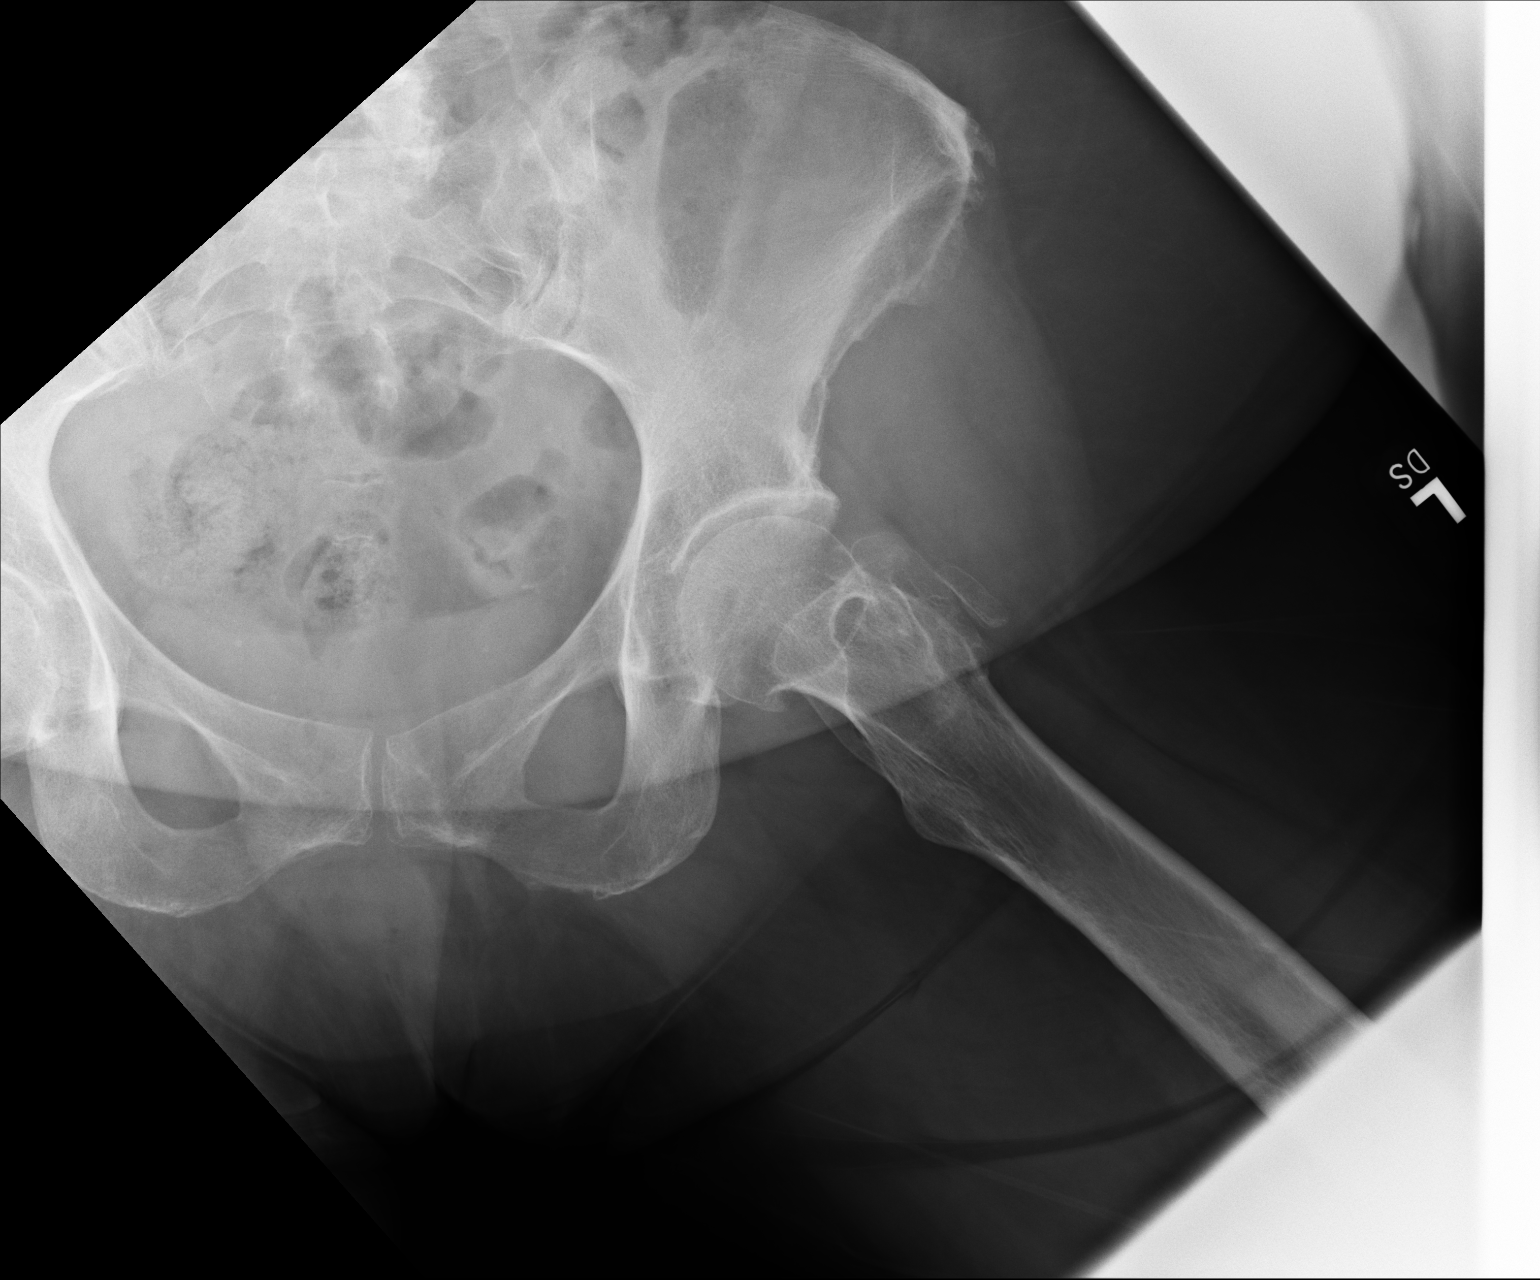
[im 3/3]
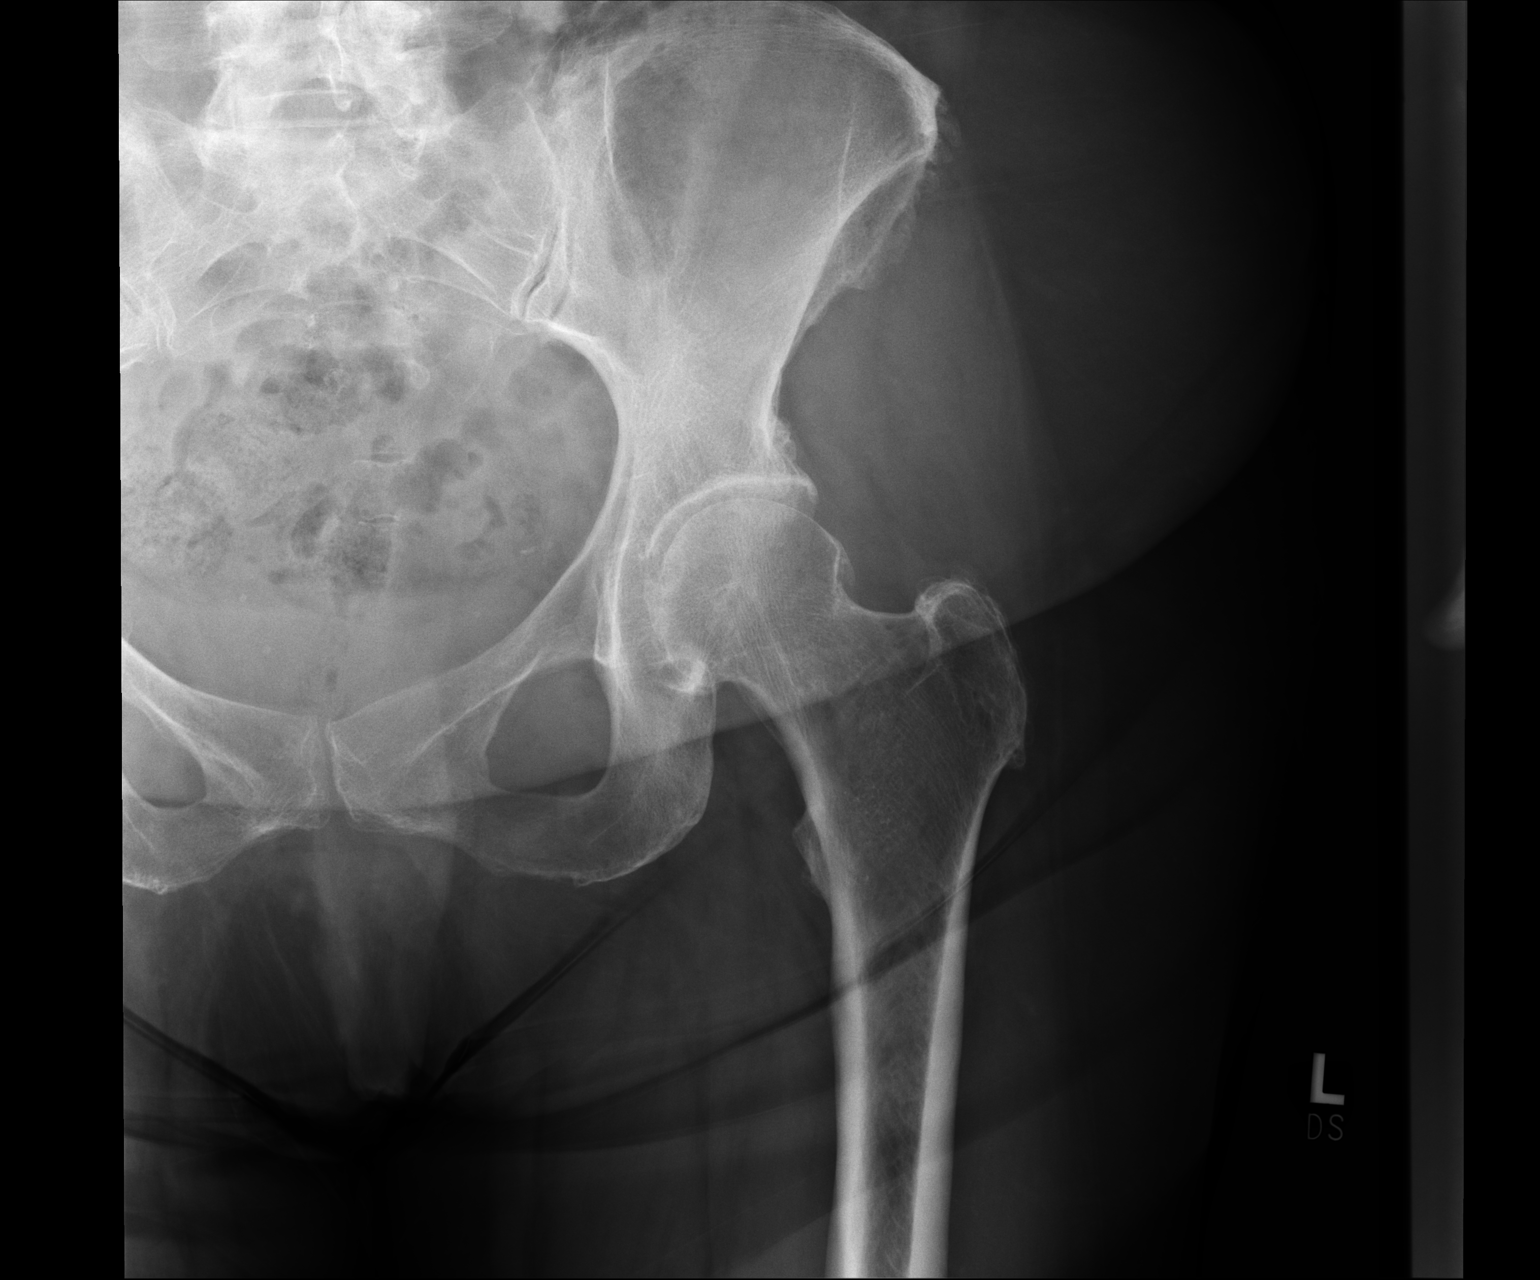

[3 of 3 positions shown; findings below may reference images not displayed]

FINDINGS: No fracture. Mild degenerative change of the hips (greater on the 
left) and SI joints. Bilateral enthesopathy. Multilevel degenerative change of 
the spine. No focal soft tissue swelling.
IMPRESSION: Mild degenerative change of the hips, greater on the left.

## 2022-11-04 IMAGING — DX LUMBAR SPINE 2 VIEW
1 series · 2 of 2 positions shown · non-contrast
Comparison: None

________________________________________________________________________________________________ 
LUMBAR SPINE 2 VIEW, 11/04/2022 [DATE]: 
CLINICAL INDICATION: Left hip pain

[Series 1: AP · U · 0.14mm/px · 2 of 2 slices shown]
[im 1/2]
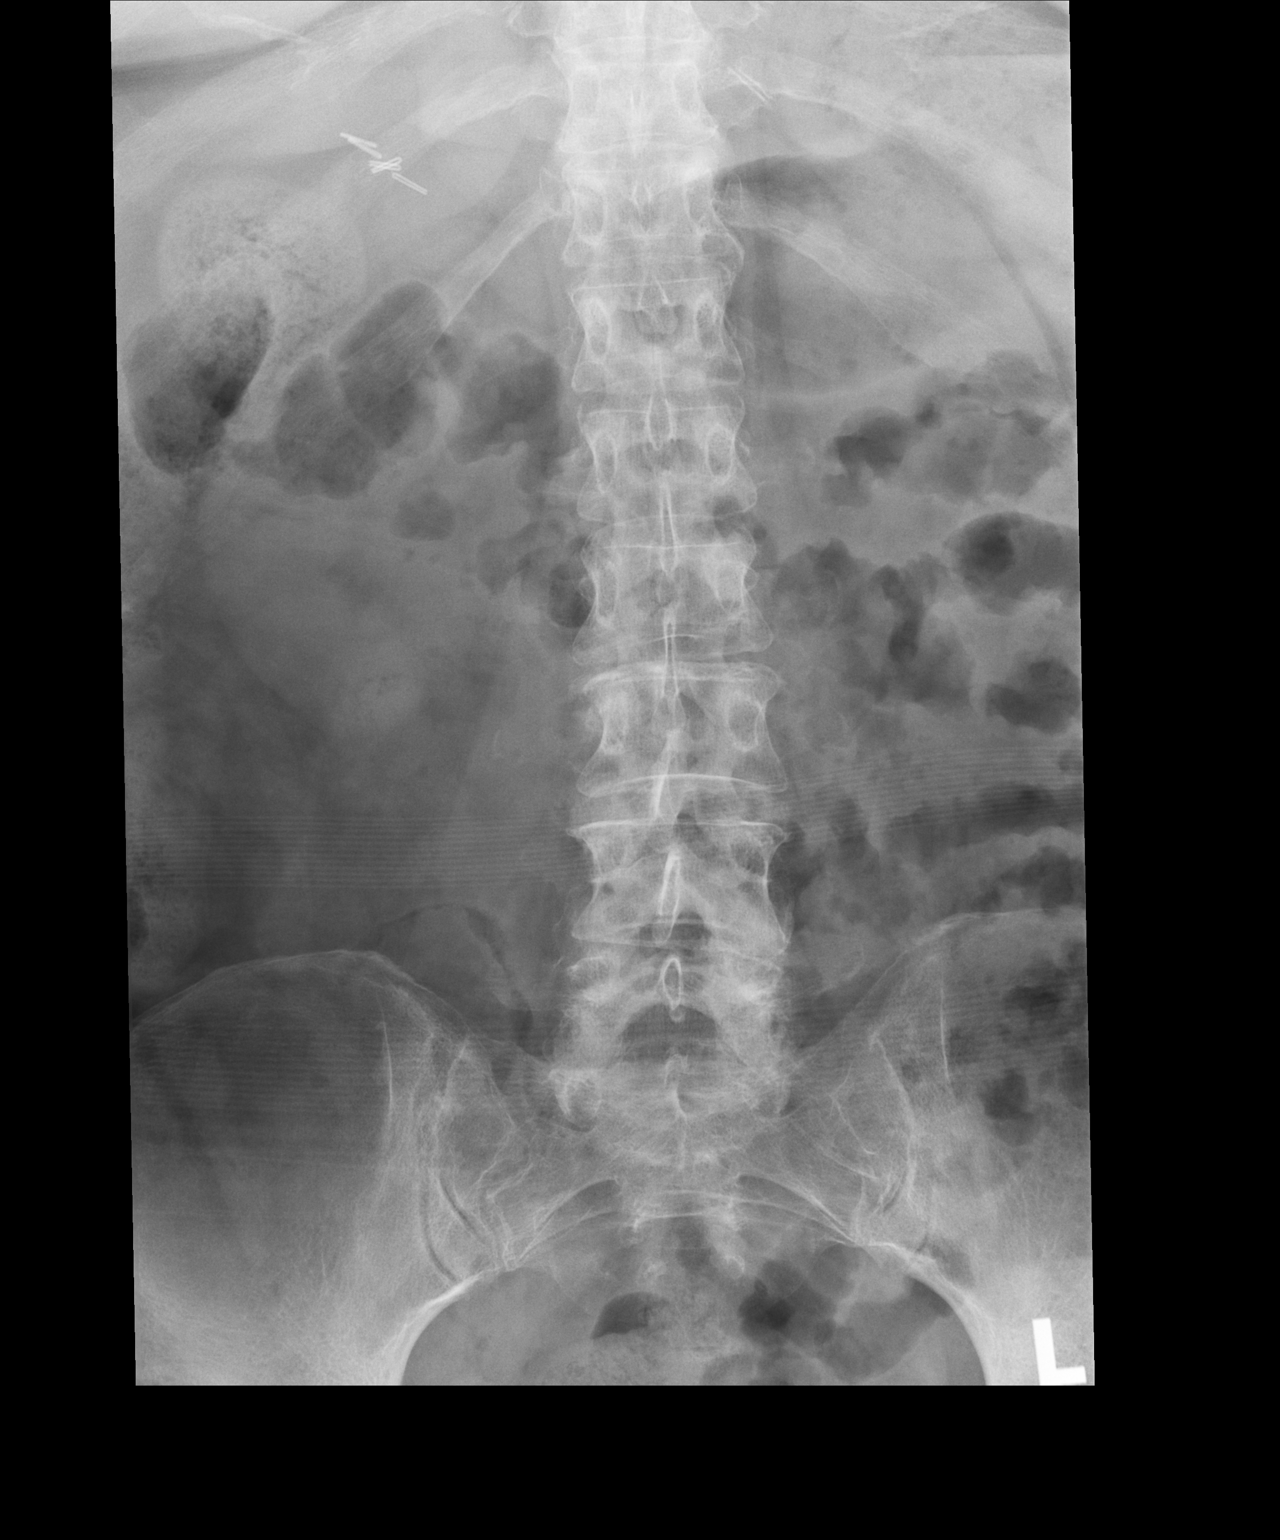
[im 2/2]
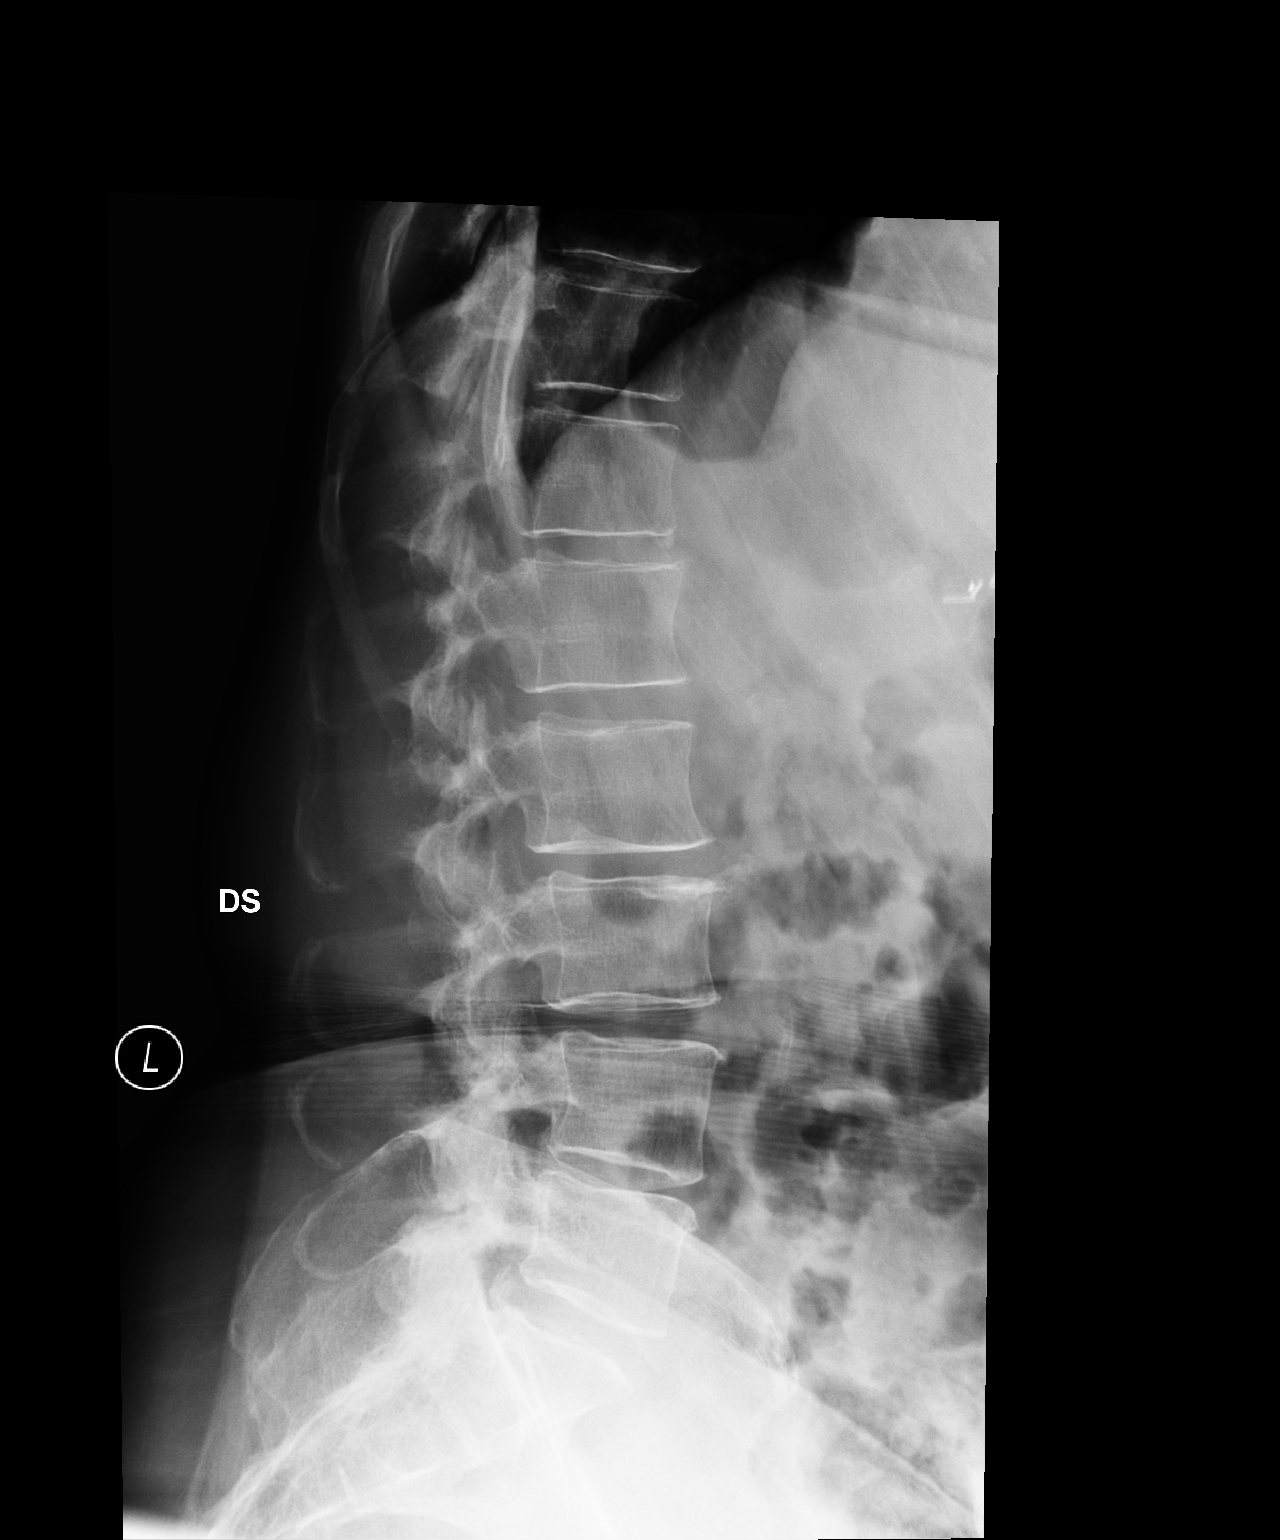

[2 of 2 positions shown; findings below may reference images not displayed]

FINDINGS: Disc heights appear preserved. There is lower lumbar facet 
hypertrophy. No vertebral body fracture. No spondylolisthesis. SI joints and 
included hip joint spaces appear preserved. Surgical clips right upper quadrant.
IMPRESSION: Mild spondylotic changes lumbar spine. If symptoms persist, consideration could 
be made for MR exam.

## 2023-07-23 IMAGING — MR MRI LUMBAR SPINE WITHOUT CONTRAST
4 of 8 series · 8 of 48 positions shown · IV contrast (gadolinium)
Comparison: Lumbar spine x-ray November 04, 2022.

________________________________________________________________________________________________ 
MRI LUMBAR SPINE WITHOUT CONTRAST, 07/23/2023 [DATE]: 
CLINICAL INDICATION: Low back pain, unspecified , pain in low back with 
radiation down the left leg. No trauma.
TECHNIQUE: Multiplanar, multiecho position MR images of the lumbar spine were 
performed without intravenous gadolinium enhancement. Patient was scanned on a 
3T magnet

[Series 101: survey · axial · 10.0mm · 1.39mm/px · 1 of 9 slices shown]
[im 1/9]
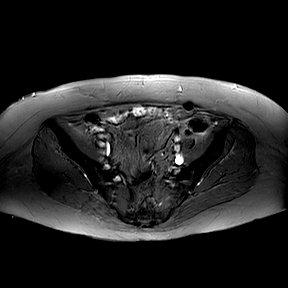

[Series 201: t2w_cor-surv · coronal · 6.0mm · 0.50mm/px · 1 of 5 slices shown]
[im 1/5]
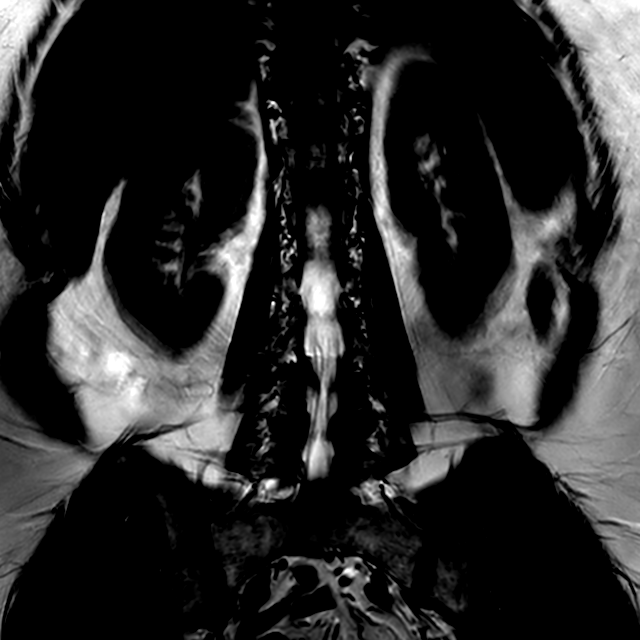

[Series 301: t1w_tse sag · sagittal · 4.0mm · 0.25mm/px · 3 of 17 slices shown]
[im 1/17]
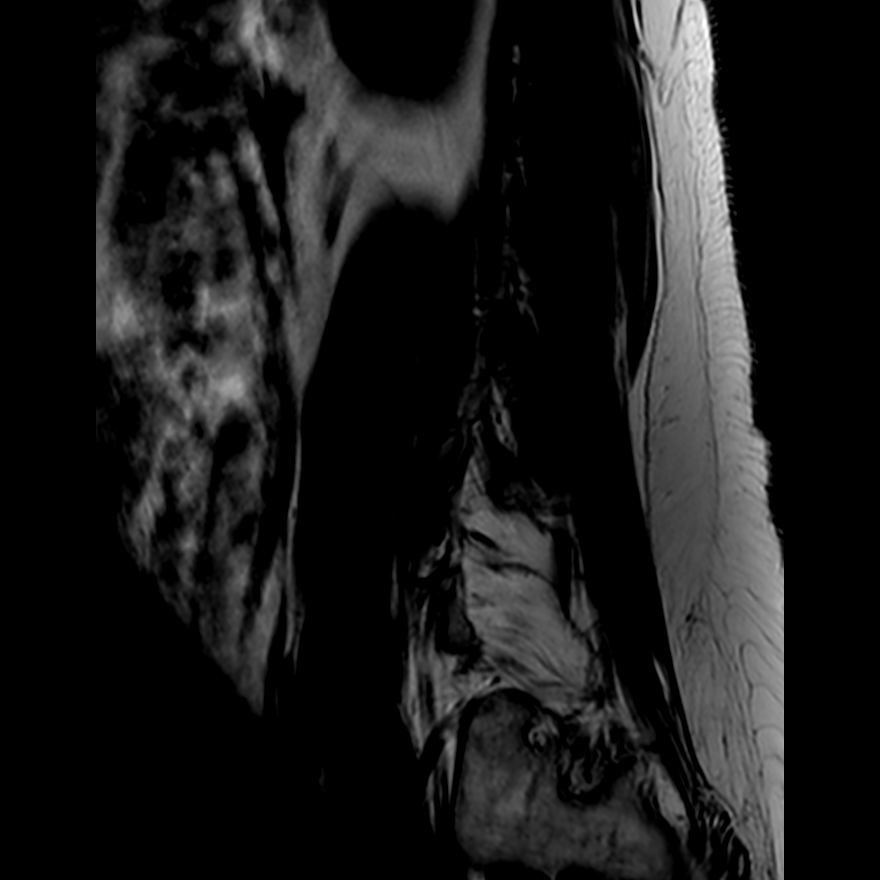
[im 9/17]
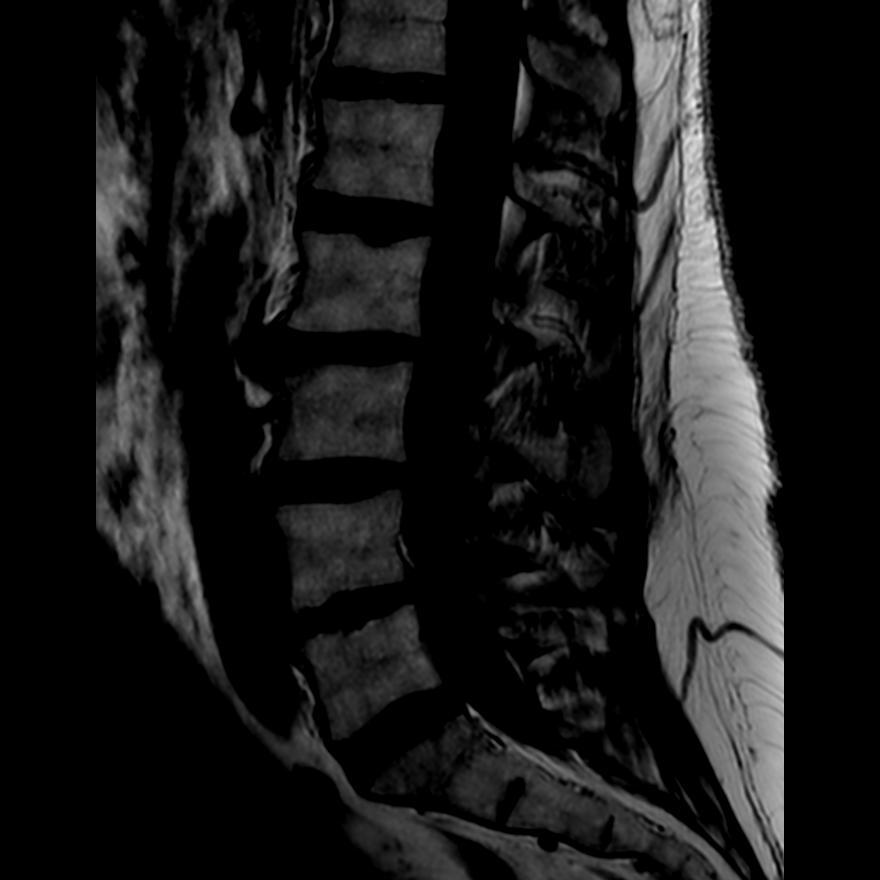
[im 17/17]
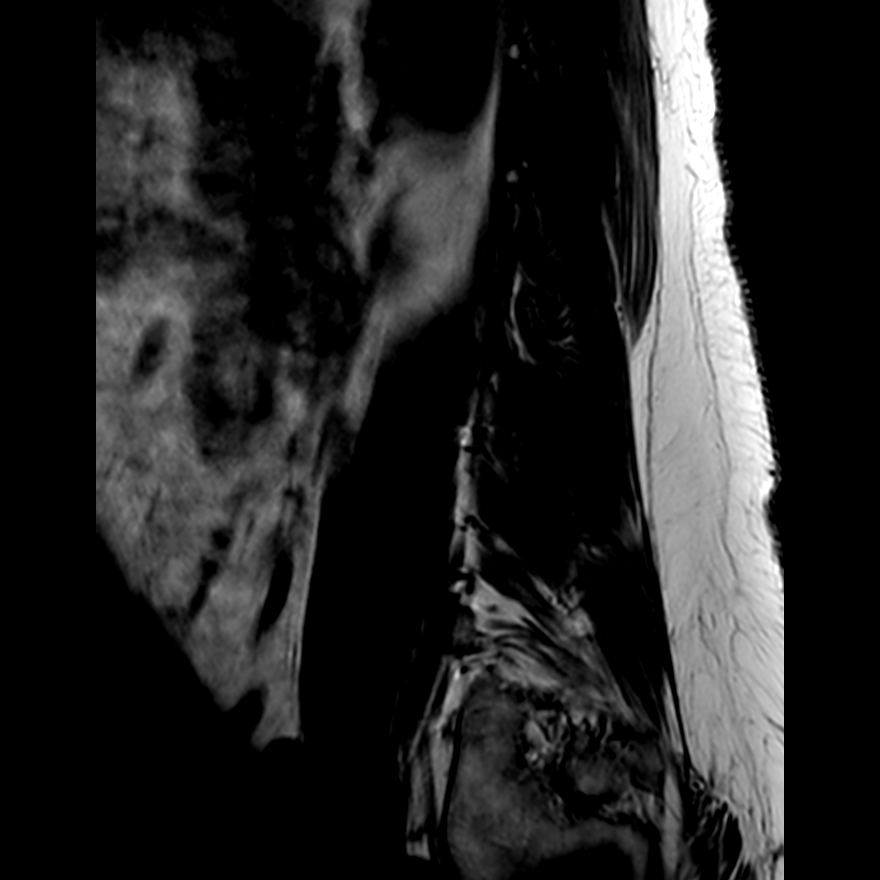

[Series 401: t2w_tse sag · sagittal · 4.0mm · 0.25mm/px · 3 of 17 slices shown]
[im 1/17]
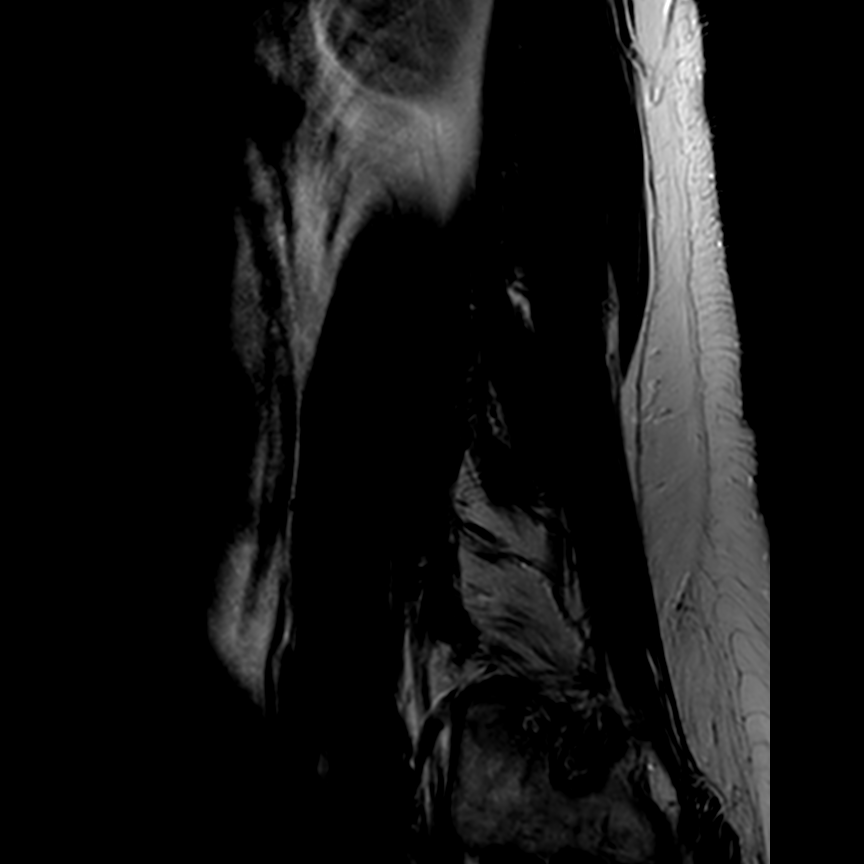
[im 9/17]
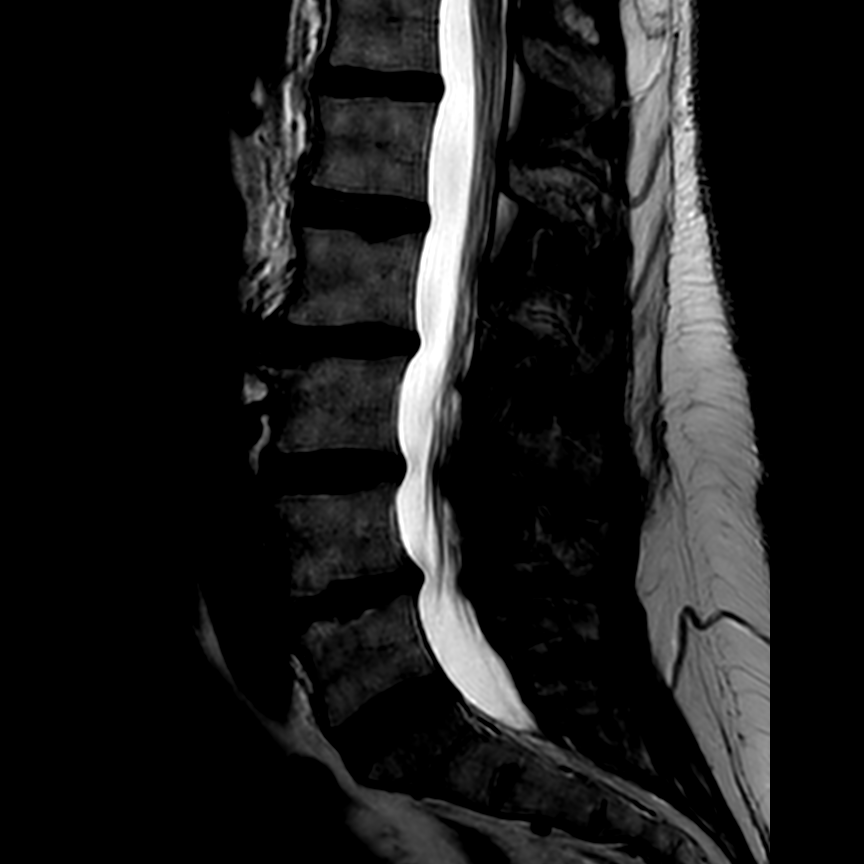
[im 17/17]
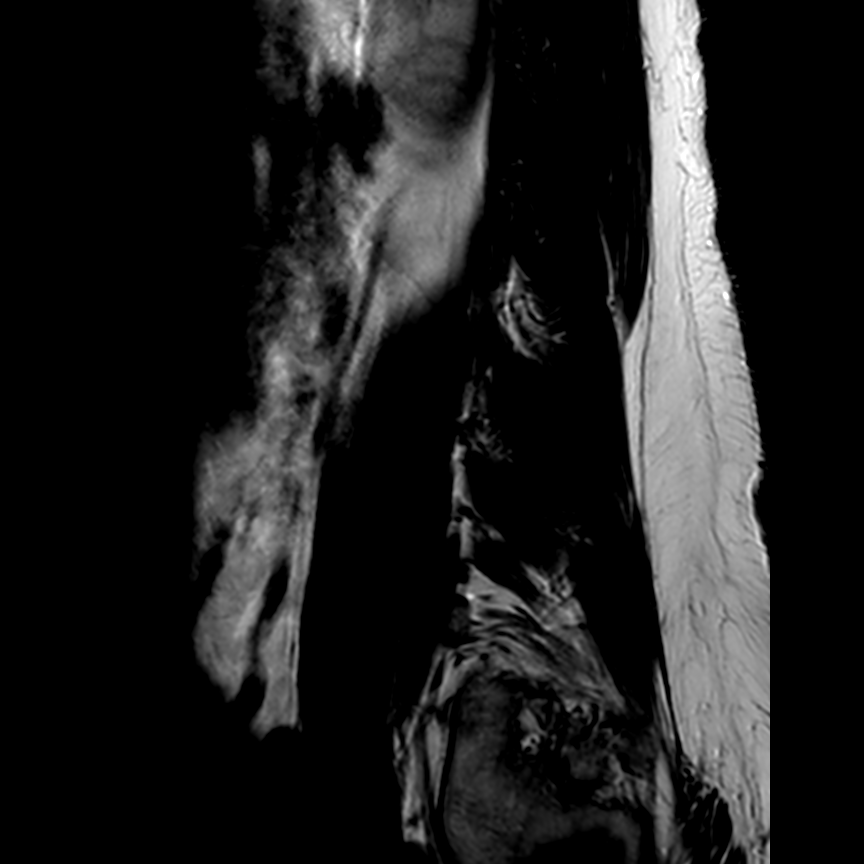

[8 of 48 positions shown; findings below may reference images not displayed]

FINDINGS: -------------------------------------------------------------------------------- 
------ 
GENERAL: 
Nomenclature is based on 5 lumbar type vertebral bodies.     
ALIGNMENT: Minimal levoconvex lumbar scoliosis. Normal sagittal alignment. 
VERTEBRAL BODY HEIGHT: Normal.  
MARROW SIGNAL: Incomplete visualized is a suspected hemangioma T12. No evidence 
of a marrow infiltrative process. 
CORD SIGNAL: Normal distal spinal cord and cauda equina. Conus medullaris 
terminates at L1. 
ADDITIONAL FINDINGS: None. 
Modic I-II: L4-L5, L3-L4 
Ligamentum Flavum > 2.5 mm: All levels. 
-------------------------------------------------------------------------------- 
------ 
SEGMENTAL: 
T12-L1: Loss of disc signal. Minimal annular bulge. Otherwise normal. 
L1-L2: Loss of disc signal with Schmorls node. Minimal annular bulge. Canal and 
foramina are patent. Normal facets. 
L2-L3: Loss of disc height and signal with Schmorls nodes. Mild annular bulge. 
Canal patent. Small bilateral facet joint effusions. Foramina patent. 
L3-L4: Loss of disc signal with mild annular bulge. Mild canal stenosis with 
mild narrowing of the lateral recesses bilaterally and facet arthropathy with 
bilateral facet joint effusions. Mild bilateral foraminal narrowing. 
L4-L5: Loss of disc height and signal. Endplate irregularity is degenerative. 
Mild diffuse annular bulge creating mild to moderate canal stenosis and mild 
narrowing of the lateral recesses bilaterally. Facet arthropathy. Mild bilateral 
foraminal narrowing. 
L5-S1: Loss of disc signal. Canal and right foramina patent. Left foraminal disc 
protrusion abuts the exiting left L5 nerve root with mild left foraminal 
narrowing. Facet arthropathy. Left facet joint effusion. 
-------------------------------------------------------------------------------- 
------
IMPRESSION: Lumbar degenerative and scoliotic changes. 
Most significant canal stenosis is at L4-L5, mild to moderate with mild 
narrowing of the lateral recesses bilaterally. Foraminal narrowing as detailed 
above. 
L5-S1, left foraminal disc protrusion abuts the exiting left L5 nerve root with 
mild left foraminal narrowing.
